# Patient Record
Sex: Female | Born: 2000 | Race: Black or African American | Hispanic: No | Marital: Single | Smoking: Never smoker
Health system: Southern US, Community
[De-identification: ages and names within clinical notes are randomized; demographics above are authoritative.]

## PROBLEM LIST (undated history)

## (undated) DIAGNOSIS — R519 Headache, unspecified: Secondary | ICD-10-CM

## (undated) DIAGNOSIS — R51 Headache: Secondary | ICD-10-CM

---
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
## 2019-12-05 ENCOUNTER — Emergency Department (HOSPITAL_BASED_OUTPATIENT_CLINIC_OR_DEPARTMENT_OTHER)
Admission: EM | Admit: 2019-12-05 | Discharge: 2019-12-05 | Discharge status: Against Medical Advice | Payer: Medicaid Other

## 2019-12-05 ENCOUNTER — Other Ambulatory Visit: Payer: Self-pay

## 2019-12-05 NOTE — ED Notes (Signed)
Called 3 times for triage and no response.

## 2020-06-13 ENCOUNTER — Emergency Department (HOSPITAL_BASED_OUTPATIENT_CLINIC_OR_DEPARTMENT_OTHER)
Admission: EM | Admit: 2020-06-13 | Discharge: 2020-06-13 | Disposition: A | Discharge status: Home / Self Care | Payer: Medicaid Other | Attending: Emergency Medicine | Admitting: Emergency Medicine

## 2020-06-13 ENCOUNTER — Emergency Department (HOSPITAL_BASED_OUTPATIENT_CLINIC_OR_DEPARTMENT_OTHER): Payer: Medicaid Other

## 2020-06-13 ENCOUNTER — Encounter (HOSPITAL_BASED_OUTPATIENT_CLINIC_OR_DEPARTMENT_OTHER): Payer: Self-pay | Admitting: Emergency Medicine

## 2020-06-13 ENCOUNTER — Other Ambulatory Visit: Payer: Self-pay

## 2020-06-13 DIAGNOSIS — N201 Calculus of ureter: Secondary | ICD-10-CM | POA: Diagnosis not present

## 2020-06-13 DIAGNOSIS — R109 Unspecified abdominal pain: Secondary | ICD-10-CM | POA: Diagnosis present

## 2020-06-13 LAB — COMPREHENSIVE METABOLIC PANEL
ALT: 55 U/L — ABNORMAL HIGH (ref 0–44)
AST: 33 U/L (ref 15–41)
Albumin: 4.2 g/dL (ref 3.5–5.0)
Alkaline Phosphatase: 54 U/L (ref 38–126)
Anion gap: 10 (ref 5–15)
BUN: 14 mg/dL (ref 6–20)
CO2: 22 mmol/L (ref 22–32)
Calcium: 8.8 mg/dL — ABNORMAL LOW (ref 8.9–10.3)
Chloride: 101 mmol/L (ref 98–111)
Creatinine, Ser: 0.77 mg/dL (ref 0.44–1.00)
GFR calc Af Amer: >60 mL/min (ref >60)
GFR calc non Af Amer: >60 mL/min (ref >60)
Glucose, Bld: 140 mg/dL — ABNORMAL HIGH (ref 70–99)
Potassium: 3.2 mmol/L — ABNORMAL LOW (ref 3.5–5.1)
Sodium: 133 mmol/L — ABNORMAL LOW (ref 135–145)
Total Bilirubin: 0.3 mg/dL (ref 0.3–1.2)
Total Protein: 7.6 g/dL (ref 6.5–8.1)

## 2020-06-13 LAB — URINALYSIS, ROUTINE W REFLEX MICROSCOPIC
Bilirubin Urine: NEGATIVE
Glucose, UA: NEGATIVE mg/dL
Ketones, ur: NEGATIVE mg/dL
Nitrite: NEGATIVE
Protein, ur: 30 mg/dL — AB
Specific Gravity, Urine: >1.03 — ABNORMAL HIGH (ref 1.005–1.030)
pH: 5.5 (ref 5.0–8.0)

## 2020-06-13 LAB — CBC
HCT: 37.5 % (ref 36.0–46.0)
Hemoglobin: 12.8 g/dL (ref 12.0–15.0)
MCH: 29 pg (ref 26.0–34.0)
MCHC: 34.1 g/dL (ref 30.0–36.0)
MCV: 85 fL (ref 80.0–100.0)
Platelets: 300 10*3/uL (ref 150–400)
RBC: 4.41 MIL/uL (ref 3.87–5.11)
RDW: 12.6 % (ref 11.5–15.5)
WBC: 8.1 10*3/uL (ref 4.0–10.5)
nRBC: 0 % (ref 0.0–0.2)

## 2020-06-13 LAB — URINALYSIS, MICROSCOPIC (REFLEX): RBC / HPF: >50 RBC/hpf (ref 0–5)

## 2020-06-13 LAB — HCG, SERUM, QUALITATIVE: Preg, Serum: NEGATIVE

## 2020-06-13 MED ORDER — HYDROCODONE-ACETAMINOPHEN 5-325 MG PO TABS: 0.5000 | ORAL_TABLET | Freq: Three times a day (TID) | ORAL | 0 refills | Status: AC | PRN

## 2020-06-13 MED ORDER — IBUPROFEN 600 MG PO TABS: 600.0000 mg | ORAL_TABLET | Freq: Four times a day (QID) | ORAL | 0 refills | Status: DC | PRN

## 2020-06-13 MED ORDER — CEPHALEXIN 250 MG PO CAPS
500.0000 mg | ORAL_CAPSULE | Freq: Once | ORAL | Status: AC
  Administered 2020-06-13: 500 mg via ORAL
  Filled 2020-06-13: qty 2

## 2020-06-13 MED ORDER — ONDANSETRON HCL 4 MG/2ML IJ SOLN
4.0000 mg | Freq: Once | INTRAMUSCULAR | Status: AC | PRN
  Administered 2020-06-13: 4 mg via INTRAVENOUS
  Filled 2020-06-13: qty 2

## 2020-06-13 MED ORDER — CEPHALEXIN 500 MG PO CAPS: 500.0000 mg | ORAL_CAPSULE | Freq: Three times a day (TID) | ORAL | 0 refills | Status: AC

## 2020-06-13 MED ORDER — FENTANYL CITRATE (PF) 100 MCG/2ML IJ SOLN
50.0000 ug | INTRAMUSCULAR | Status: DC | PRN
  Administered 2020-06-13: 50 ug via INTRAVENOUS
  Filled 2020-06-13: qty 2

## 2020-06-13 NOTE — ED Notes (Signed)
Pt used bathroom in lobby. Unable to provide urine sample at this time.

## 2020-06-13 NOTE — ED Notes (Signed)
ED Provider at bedside. 

## 2020-06-13 NOTE — ED Triage Notes (Signed)
Pt c/o right sided abd pain that started at 9pm.

## 2020-06-13 NOTE — ED Provider Notes (Addendum)
[facility] EMERGENCY DEPARTMENT Provider Note  CSN: [id number] Arrival date & time: 06/13/20 [id number]  Chief Complaint(s) Abdominal Pain  HPI Tracy Lowery is a 19 y.o. female    CC: Flank pain  Onset/Duration: Sudden 2 hours ago Timing: Constant and fluctuating Location: Right side Quality: Cramping Severity: Severe Modifying Factors:  Improved by: Nothing  Worsened by: Nothing Associated Signs/Symptoms:  Pertinent (+): Nausea and one episode of nonbloody nonbilious emesis  Pertinent (-): Fevers, chills, chest pain, shortness of breath, dysuria Context: Last menstrual period 6/14   HPI  Past Medical History Past Medical History:  Diagnosis Date  . Frequent headaches    There are no problems to display for this patient.  Home Medication(s) Prior to Admission medications   Medication Sig Start Date End Date Taking? Authorizing Provider  cephALEXin (KEFLEX) 500 MG capsule Take 1 capsule (500 mg total) by mouth 3 (three) times daily for 5 days. 06/13/20 06/18/20  Nira Connardama, Payal Stanforth Eduardo, MD  HYDROcodone-acetaminophen (NORCO/VICODIN) 5-325 MG tablet Take 0.5-1 tablets by mouth every 8 (eight) hours as needed for up to 5 days for severe pain (That is not improved by your scheduled motrin regimen). Please do not exceed 4000 mg of acetaminophen (Tylenol) a 24-hour period. Please note that he may be prescribed additional medicine that contains acetaminophen. 06/13/20 06/18/20  Nira Connardama, Jaliel Deavers Eduardo, MD  ibuprofen (ADVIL) 600 MG tablet Take 1 tablet (600 mg total) by mouth every 6 (six) hours as needed. 06/13/20   Nira Connardama, Kaiyla Stahly Eduardo, MD                                                                                                                                    Past Surgical History History reviewed. No pertinent surgical history. Family History No family history on file.  Social History Social History   Tobacco Use  . Smoking status: Never Smoker  Substance Use  Topics  . Alcohol use: No  . Drug use: No   Allergies Patient has no known allergies.  Review of Systems Review of Systems All other systems are reviewed and are negative for acute change except as noted in the HPI  Physical Exam Vital Signs  I have reviewed the triage vital signs BP (!) 120/57   Pulse 75   Temp 97.9 F (36.6 C) (Oral)   Resp 16   Ht 5\' 6"  (1.676 m)   Wt 63.5 kg   LMP 05/15/2020 (Approximate)   SpO2 100%   BMI 22.60 kg/m   Physical Exam Vitals reviewed.  Constitutional:      General: She is not in acute distress.    Appearance: She is well-developed. She is not diaphoretic.  HENT:     Head: Normocephalic and atraumatic.     Right Ear: External ear normal.     Left Ear: External ear normal.     Nose: Nose normal.  Eyes:     General: No  scleral icterus.    Conjunctiva/sclera: Conjunctivae normal.  Neck:     Trachea: Phonation normal.  Cardiovascular:     Rate and Rhythm: Normal rate and regular rhythm.  Pulmonary:     Effort: Pulmonary effort is normal. No respiratory distress.     Breath sounds: No stridor.  Abdominal:     General: There is no distension.     Tenderness: There is no abdominal tenderness. There is no guarding or rebound. Negative signs include Rovsing's sign, McBurney's sign, psoas sign and obturator sign.  Musculoskeletal:        General: Normal range of motion.     Cervical back: Normal range of motion.  Neurological:     Mental Status: She is alert and oriented to person, place, and time.  Psychiatric:        Behavior: Behavior normal.     ED Results and Treatments Labs (all labs ordered are listed, but only abnormal results are displayed) Labs Reviewed  COMPREHENSIVE METABOLIC PANEL - Abnormal; Notable for the following components:      Result Value   Sodium 133 (*)    Potassium 3.2 (*)    Glucose, Bld 140 (*)    Calcium 8.8 (*)    ALT 55 (*)    All other components within normal limits  URINALYSIS, ROUTINE W  REFLEX MICROSCOPIC - Abnormal; Notable for the following components:   APPearance CLOUDY (*)    Specific Gravity, Urine >1.030 (*)    Hgb urine dipstick LARGE (*)    Protein, ur 30 (*)    Leukocytes,Ua TRACE (*)    All other components within normal limits  URINALYSIS, MICROSCOPIC (REFLEX) - Abnormal; Notable for the following components:   Bacteria, UA MANY (*)    All other components within normal limits  CBC  HCG, SERUM, QUALITATIVE                                                                                                                         EKG  EKG Interpretation  Date/Time:    Ventricular Rate:    PR Interval:    QRS Duration:   QT Interval:    QTC Calculation:   R Axis:     Text Interpretation:        Radiology CT Renal Stone Study  Result Date: 06/13/2020 CLINICAL DATA:  Right-sided abdominal pain EXAM: CT ABDOMEN AND PELVIS WITHOUT CONTRAST TECHNIQUE: Multidetector CT imaging of the abdomen and pelvis was performed following the standard protocol without IV contrast. COMPARISON:  None available FINDINGS: Lower chest:  No contributory findings. Hepatobiliary: No focal liver abnormality.No evidence of biliary obstruction or stone. Pancreas: Unremarkable. Spleen: Unremarkable. Adrenals/Urinary Tract: Negative adrenals. Mild right hydroureteronephrosis and cortical low-density related to a 3 mm distal ureteral calculus, at the right hemipelvis. Unremarkable bladder. Stomach/Bowel:  No obstruction. No appendicitis. Vascular/Lymphatic: No acute vascular abnormality. No mass or adenopathy. Reproductive:IUD in place. Other: No ascites or pneumoperitoneum. Musculoskeletal: No acute abnormalities. L4-5 shallow  disc protrusion. IMPRESSION: 3 mm distal right ureteral calculus with mild hydronephrosis. Electronically Signed   By: Marnee Spring M.D.   On: 06/13/2020 05:45    Pertinent labs & imaging results that were available during my care of the patient were reviewed by me  and considered in my medical decision making (see chart for details).  Medications Ordered in ED Medications  fentaNYL (SUBLIMAZE) injection 50 mcg (50 mcg Intravenous Given 06/13/20 0352)  ondansetron (ZOFRAN) injection 4 mg (4 mg Intravenous Given 06/13/20 0352)  cephALEXin (KEFLEX) capsule 500 mg (500 mg Oral Given 06/13/20 3500)                                                                                                                                    Procedures Procedures  (including critical care time)  Medical Decision Making / ED Course I have reviewed the nursing notes for this encounter and the patient's prior records (if available in EHR or on provided paperwork).   Annahi Short was evaluated in Emergency Department on 06/13/2020 for the symptoms described in the history of present illness. She was evaluated in the context of the global COVID-19 pandemic, which necessitated consideration that the patient might be at risk for infection with the SARS-CoV-2 virus that causes COVID-19. Institutional protocols and algorithms that pertain to the evaluation of patients at risk for COVID-19 are in a state of rapid change based on information released by regulatory bodies including the CDC and federal and state organizations. These policies and algorithms were followed during the patient's care in the ED.  Work up consistent with right ureteral stone. UA with bacteria and +leuks. Will treat for possible UTI as well.  Pain controlled. Negative pregnancy. Not suspicious for torsion      Final Clinical Impression(s) / ED Diagnoses Final diagnoses:  Right ureteral stone   The patient appears reasonably screened and/or stabilized for discharge and I doubt any other medical condition or other [facility] requiring further screening, evaluation, or treatment in the ED at this time prior to discharge. Safe for discharge with strict return precautions.  Disposition: Discharge  Condition:  Good  I have discussed the results, Dx and Tx plan with the patient/family who expressed understanding and agree(s) with the plan. Discharge instructions discussed at length. The patient/family was given strict return precautions who verbalized understanding of the instructions. No further questions at time of discharge.    ED Discharge Orders         Ordered    ibuprofen (ADVIL) 600 MG tablet  Every 6 hours PRN     Discontinue  Reprint     06/13/20 0617    HYDROcodone-acetaminophen (NORCO/VICODIN) 5-325 MG tablet  Every 8 hours PRN     Discontinue  Reprint     06/13/20 0617    cephALEXin (KEFLEX) 500 MG capsule  3 times daily     Discontinue  Reprint  06/13/20 0628          [place] narcotic database reviewed and no active prescriptions noted.   Follow Up: [facility] [address] [place] [address] [place] [address] [phone] Call  As needed      This chart was dictated using voice recognition software.  Despite best efforts to proofread,  errors can occur which can change the documentation meaning.     Nira Conn, MD 06/13/20 [phone]

## 2020-06-13 NOTE — Discharge Instructions (Signed)
The most common type of stone is a calcium oxalate stone. Decreasing foods that contain oxalate can help reduce your risk of developing future stones.

## 2020-12-22 IMAGING — CT CT RENAL STONE PROTOCOL
2 of 4 series · 17 of 46 positions shown, 19 images · non-contrast
Comparison: None available

CLINICAL DATA: Right-sided abdominal pain

EXAM:
CT ABDOMEN AND PELVIS WITHOUT CONTRAST
TECHNIQUE: Multidetector CT imaging of the abdomen and pelvis was performed
following the standard protocol without IV contrast.

[Series 2: axial st · axial · 0.61mm/px · z∈[+848,+1218]mm · 14 of 82 slices shown, 16 images]
[im 4/82  soft-tissue]
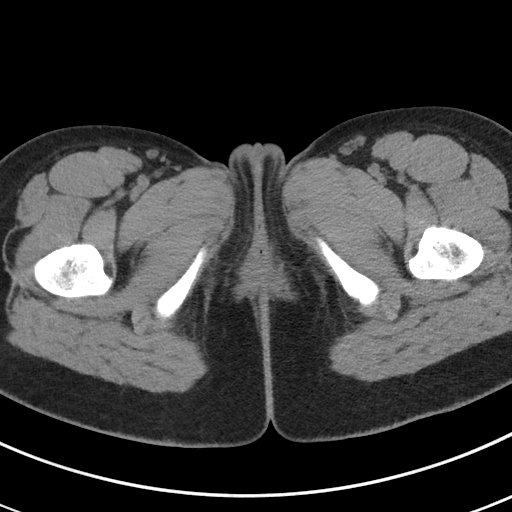
[im 4/82  bone]
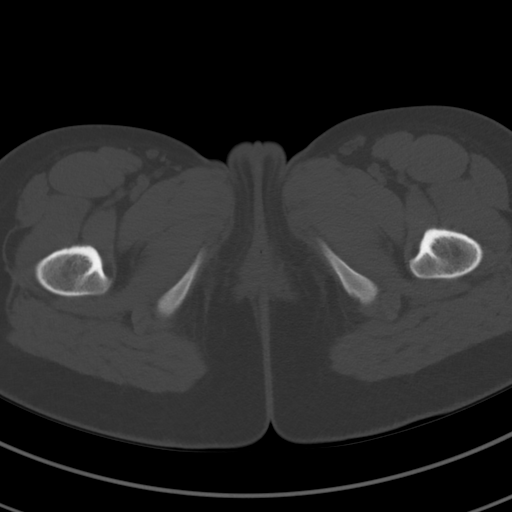
[im 11/82  soft-tissue]
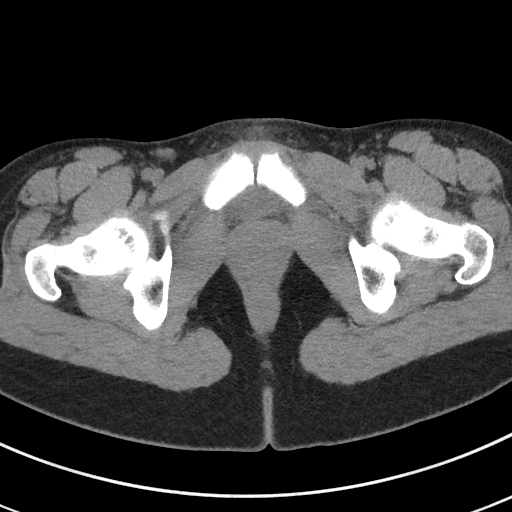
[im 17/82  soft-tissue]
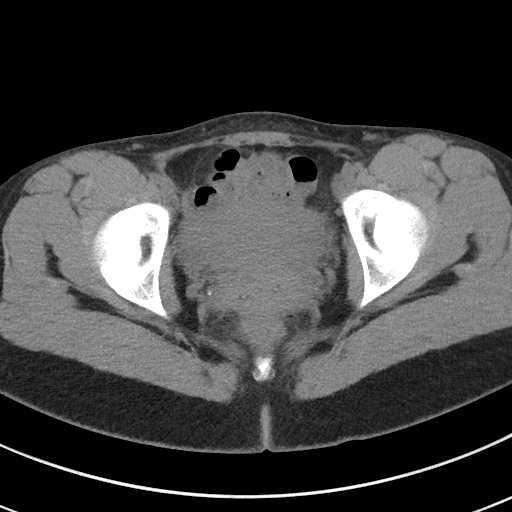
[im 21/82  soft-tissue]
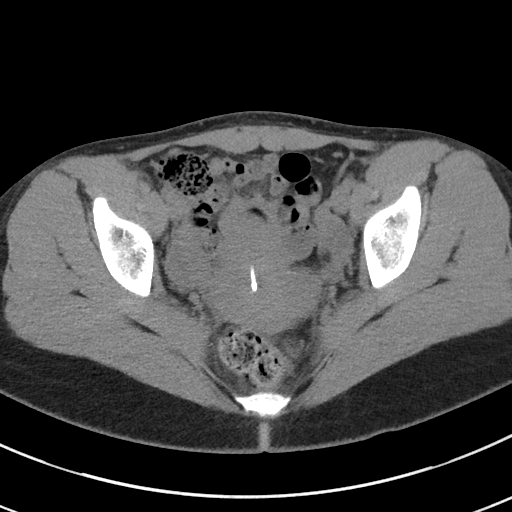
[im 28/82  soft-tissue]
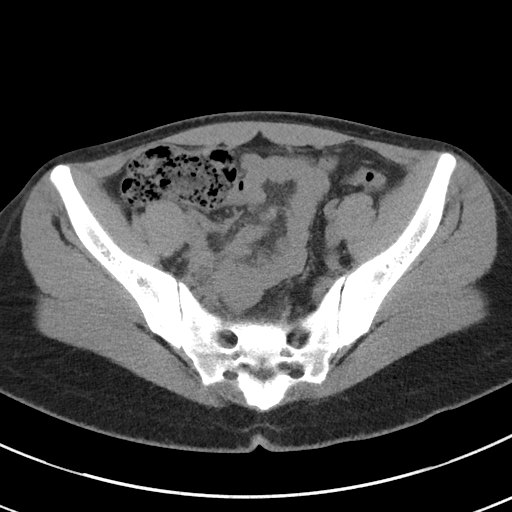
[im 34/82  soft-tissue]
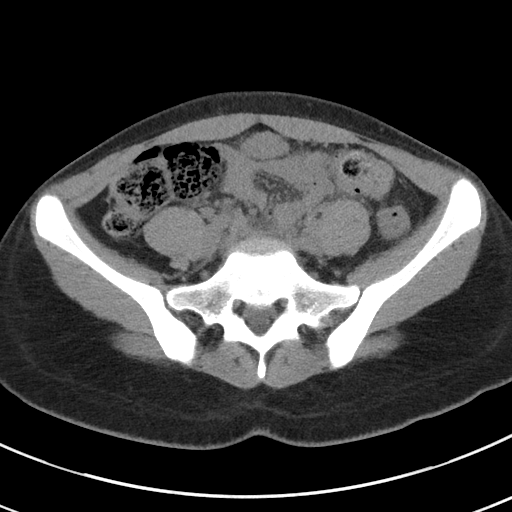
[im 38/82  soft-tissue]
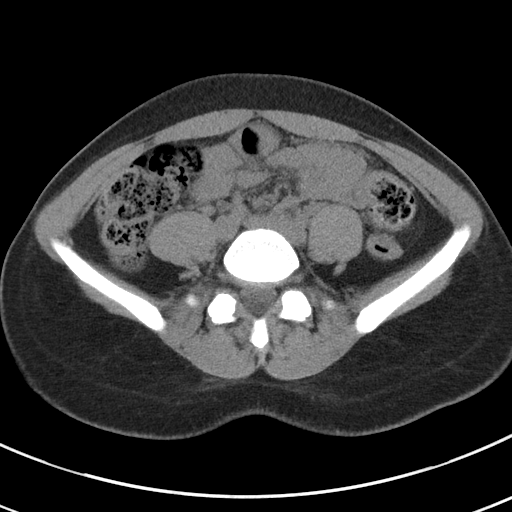
[im 44/82  soft-tissue]
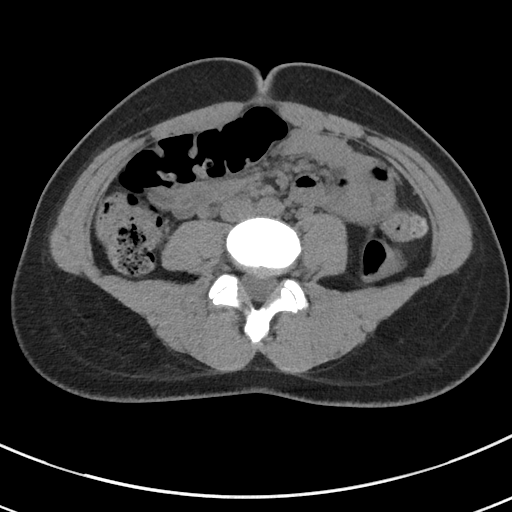
[im 48/82  soft-tissue]
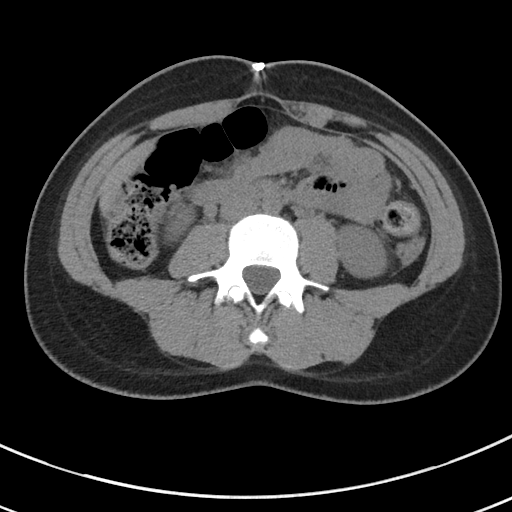
[im 48/82  bone]
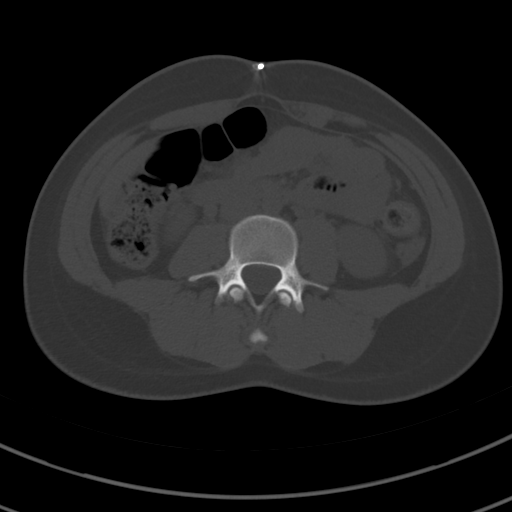
[im 55/82  soft-tissue]
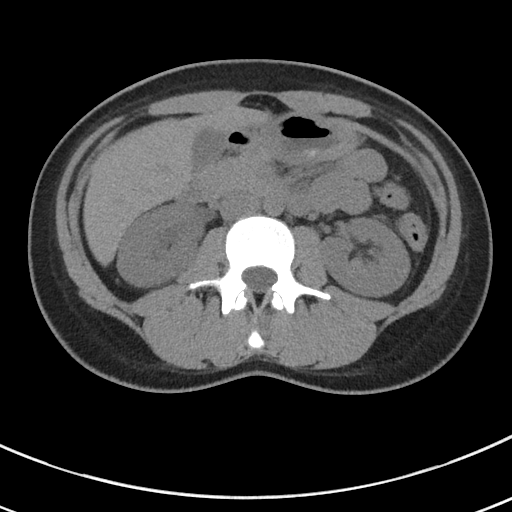
[im 61/82  soft-tissue]
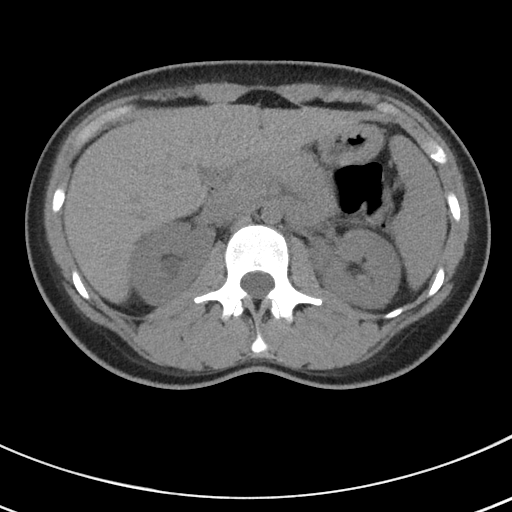
[im 65/82  soft-tissue]
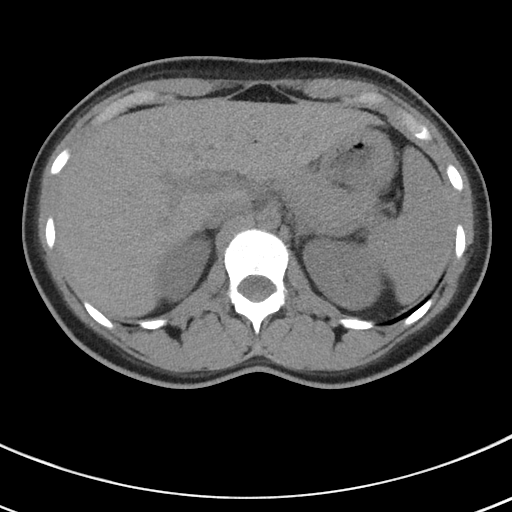
[im 71/82  soft-tissue]
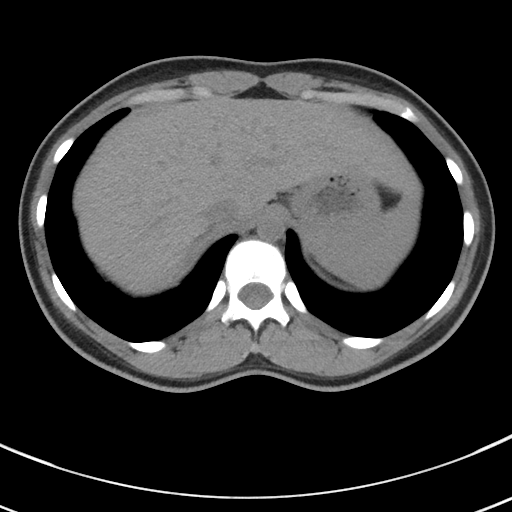
[im 78/82  soft-tissue]
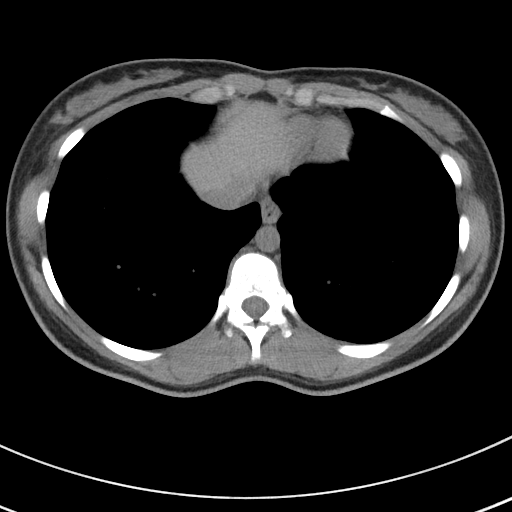

[Series 4: coronal st · coronal · 0.74mm/px · 3 of 79 slices shown]
[im 27/79  soft-tissue]
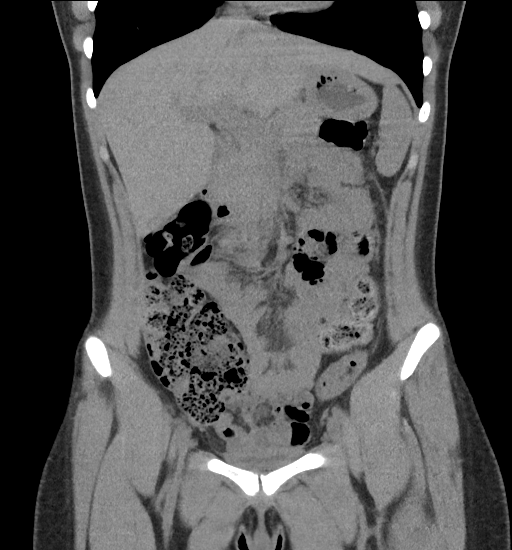
[im 35/79  soft-tissue]
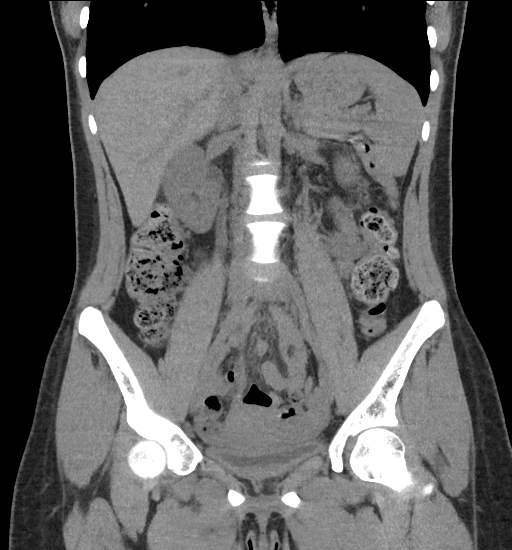
[im 44/79  soft-tissue]
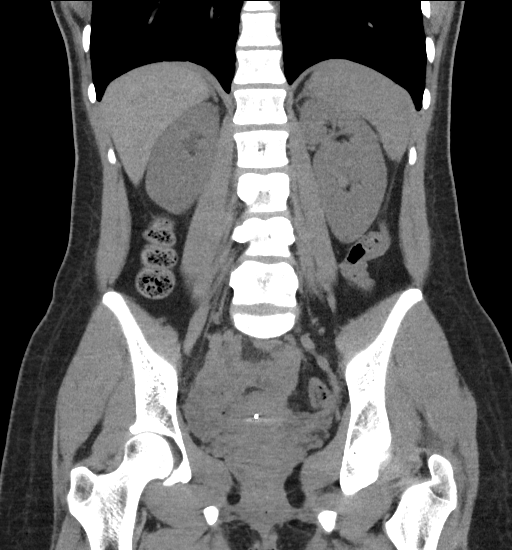

[17 of 46 positions shown; findings below may reference images not displayed]

FINDINGS: Lower chest:  No contributory findings.

Hepatobiliary: No focal liver abnormality.No evidence of biliary
obstruction or stone.

Pancreas: Unremarkable.

Spleen: Unremarkable.

Adrenals/Urinary Tract: Negative adrenals. Mild right
hydroureteronephrosis and cortical low-density related to a 3 mm
distal ureteral calculus, at the right hemipelvis. Unremarkable
bladder.

Stomach/Bowel:  No obstruction. No appendicitis.

Vascular/Lymphatic: No acute vascular abnormality. No mass or
adenopathy.

Reproductive:IUD in place.

Other: No ascites or pneumoperitoneum.

Musculoskeletal: No acute abnormalities. L4-5 shallow disc
protrusion.
IMPRESSION: 3 mm distal right ureteral calculus with mild hydronephrosis.

## 2023-04-04 ENCOUNTER — Encounter: Payer: Self-pay | Admitting: Obstetrics and Gynecology

## 2023-04-04 ENCOUNTER — Other Ambulatory Visit (HOSPITAL_COMMUNITY)
Admission: RE | Admit: 2023-04-04 | Discharge: 2023-04-04 | Disposition: A | Discharge status: Home / Self Care | Payer: 59 | Source: Ambulatory Visit | Attending: Obstetrics and Gynecology | Admitting: Obstetrics and Gynecology

## 2023-04-04 ENCOUNTER — Ambulatory Visit (INDEPENDENT_AMBULATORY_CARE_PROVIDER_SITE_OTHER): Payer: 59 | Admitting: Obstetrics and Gynecology

## 2023-04-04 VITALS — BP 113/62 | HR 59 | Wt 156.0 lb

## 2023-04-04 DIAGNOSIS — Z30432 Encounter for removal of intrauterine contraceptive device: Secondary | ICD-10-CM

## 2023-04-04 DIAGNOSIS — N760 Acute vaginitis: Secondary | ICD-10-CM | POA: Insufficient documentation

## 2023-04-04 MED ORDER — FLUCONAZOLE 150 MG PO TABS
150.0000 mg | ORAL_TABLET | Freq: Once | ORAL | 2 refills | Status: AC
Start: 2023-04-04 — End: 2023-04-04

## 2023-04-04 NOTE — Progress Notes (Signed)
NEW GYNECOLOGY PATIENT Patient name: Tracy Lowery MRN [id number]  Date of birth: [birth year] Chief Complaint:   IUD removal and Vaginitis     History:  Tracy Lowery is a 22 y.o. No obstetric history on file. being seen today for IUD removal and vaginitis. Having a rash and discharge. Kyleena inserted in 2019. Has had intermittent infections throughout the time it has been in place. Having vaginal itching and swelling is different than usual. Does not want to switch to other form of contraceptive. Not currently sexually active or seeking pregnancy.       Gynecologic History Patient's last menstrual period was 04/01/2023 (exact date). Contraception: none Last Pap: reports 2022 Last Mammogram: n/a Last Colonoscopy: n/a  Obstetric History OB History  No obstetric history on file.    Past Medical History:  Diagnosis Date   Frequent headaches     No past surgical history on file.  Current Outpatient Medications on File Prior to Visit  Medication Sig Dispense Refill   ibuprofen (ADVIL) 600 MG tablet Take 1 tablet (600 mg total) by mouth every 6 (six) hours as needed. (Patient not taking: Reported on 04/04/2023) 30 tablet 0   No current facility-administered medications on file prior to visit.    No Known Allergies  Social History:  reports that she has never smoked. She does not have any smokeless tobacco history on file. She reports that she does not drink alcohol and does not use drugs.  No family history on file.  The following portions of the patient's history were reviewed and updated as appropriate: allergies, current medications, past family history, past medical history, past social history, past surgical history and problem list.  Review of Systems Pertinent items noted in HPI and remainder of comprehensive ROS otherwise negative.  Physical Exam:  BP 113/62   Pulse (!) 59   Wt 156 lb (70.8 kg)   LMP 04/01/2023 (Exact Date)   BMI 25.18 kg/m  Physical Exam Vitals and  nursing note reviewed. Exam conducted with a chaperone present.  Constitutional:      Appearance: Normal appearance.  Cardiovascular:     Rate and Rhythm: Normal rate.  Pulmonary:     Effort: Pulmonary effort is normal.     Breath sounds: Normal breath sounds.  Genitourinary:    Cervix: Discharge present.     Comments: Erythematous and slightly edematous vulva IUD strings visualized Neurological:     General: No focal deficit present.     Mental Status: She is alert and oriented to person, place, and time.  Psychiatric:        Mood and Affect: Mood normal.        Behavior: Behavior normal.        Thought Content: Thought content normal.        Judgment: Judgment normal.    IUD Removal  Patient identified, informed consent performed, consent signed.  Patient was in the dorsal lithotomy position, normal external genitalia was noted.  A speculum was placed in the patient's vagina, normal discharge was noted, no lesions. The cervix was visualized, no lesions, no abnormal discharge.  The strings of the IUD were visualized, grasped and pulled using ring forceps. The IUD was removed in its entirety. . Patient tolerated the procedure well.      Assessment and Plan:  1. Encounter for IUD removal Now s/p uncomplicated IUD removal  2. Acute vaginitis Vaginitis noted, presumptive treatment with diflucan, vaginitis swab collected - fluconazole (DIFLUCAN) 150 MG tablet; Take 1  tablet (150 mg total) by mouth once for 1 dose.  Dispense: 1 tablet; Refill: 2 - Cervicovaginal ancillary only  Routine preventative health maintenance measures emphasized. Please refer to After Visit Summary for other counseling recommendations.   Follow-up: Return for Annual GYN.  Lorriane Shire, MD Obstetrician & Gynecologist, [facility] for [employer], [facility]

## 2023-04-04 NOTE — Patient Instructions (Signed)
Diflucan sent to the pharmacy for assumed yeast infection, if anything else returns positive on the swab, we will let you know and treat accordingly.

## 2023-04-07 LAB — CERVICOVAGINAL ANCILLARY ONLY
Bacterial Vaginitis (gardnerella): NEGATIVE
Candida Glabrata: NEGATIVE
Candida Vaginitis: POSITIVE — AB
Chlamydia: NEGATIVE
Comment: NEGATIVE
Comment: NEGATIVE
Comment: NEGATIVE
Comment: NEGATIVE
Comment: NEGATIVE
Comment: NORMAL
Neisseria Gonorrhea: NEGATIVE
Trichomonas: NEGATIVE

## 2023-07-02 ENCOUNTER — Ambulatory Visit (INDEPENDENT_AMBULATORY_CARE_PROVIDER_SITE_OTHER): Payer: 59

## 2023-07-02 ENCOUNTER — Other Ambulatory Visit (HOSPITAL_COMMUNITY)
Admission: RE | Admit: 2023-07-02 | Discharge: 2023-07-02 | Disposition: A | Discharge status: Home / Self Care | Payer: 59 | Source: Ambulatory Visit | Attending: Family Medicine | Admitting: Family Medicine

## 2023-07-02 VITALS — BP 120/56 | HR 96 | Wt 151.0 lb

## 2023-07-02 DIAGNOSIS — N898 Other specified noninflammatory disorders of vagina: Secondary | ICD-10-CM | POA: Insufficient documentation

## 2023-07-02 DIAGNOSIS — R309 Painful micturition, unspecified: Secondary | ICD-10-CM

## 2023-07-02 LAB — POCT URINALYSIS DIPSTICK
Bilirubin, UA: NEGATIVE
Blood, UA: NEGATIVE
Glucose, UA: NEGATIVE
Ketones, UA: NEGATIVE
Leukocytes, UA: NEGATIVE
Nitrite, UA: NEGATIVE
Protein, UA: NEGATIVE
Spec Grav, UA: 1.02 (ref 1.010–1.025)
Urobilinogen, UA: 0.2 E.U./dL
pH, UA: 6 (ref 5.0–8.0)

## 2023-07-02 NOTE — Progress Notes (Signed)
SUBJECTIVE:  22 y.o. female complains of yellow vaginal discharge for 4 day(s). Denies abnormal vaginal bleeding or significant pelvic pain or fever. Patient c/o painful urination. Denies history of known exposure to STD.  Patient's last menstrual period was 06/16/2023 (exact date).  OBJECTIVE:  She appears well, afebrile. Urine dipstick: not done.  ASSESSMENT:  Vaginal Discharge     PLAN:  GC, chlamydia, trichomonas, BVAG, CVAG probe sent to lab. Treatment: To be determined once lab results are received ROV prn if symptoms persist or worsen.   Seraphim Affinito l Leyton Brownlee, CMA

## 2023-07-13 ENCOUNTER — Other Ambulatory Visit: Payer: Self-pay | Admitting: Obstetrics & Gynecology

## 2023-07-13 DIAGNOSIS — B3731 Acute candidiasis of vulva and vagina: Secondary | ICD-10-CM

## 2023-07-13 MED ORDER — TERCONAZOLE 0.4 % VA CREA
1.0000 | TOPICAL_CREAM | Freq: Every day | VAGINAL | 0 refills | Status: AC
Start: 2023-07-13 — End: ?

## 2023-07-30 ENCOUNTER — Ambulatory Visit: Payer: 59 | Admitting: Obstetrics and Gynecology

## 2023-08-26 ENCOUNTER — Ambulatory Visit (INDEPENDENT_AMBULATORY_CARE_PROVIDER_SITE_OTHER): Payer: 59

## 2023-08-26 VITALS — BP 111/74 | HR 73 | Wt 149.0 lb

## 2023-08-26 DIAGNOSIS — R399 Unspecified symptoms and signs involving the genitourinary system: Secondary | ICD-10-CM

## 2023-08-26 LAB — POCT URINALYSIS DIPSTICK
Bilirubin, UA: NEGATIVE
Blood, UA: NEGATIVE
Glucose, UA: NEGATIVE
Ketones, UA: NEGATIVE
Nitrite, UA: POSITIVE
Protein, UA: NEGATIVE
Spec Grav, UA: 1.02 (ref 1.010–1.025)
Urobilinogen, UA: 0.2 E.U./dL
pH, UA: 7 (ref 5.0–8.0)

## 2023-08-26 MED ORDER — NITROFURANTOIN MONOHYD MACRO 100 MG PO CAPS
ORAL_CAPSULE | ORAL | 0 refills | Status: DC
Start: 2023-08-26 — End: 2024-06-24

## 2023-08-26 NOTE — Progress Notes (Addendum)
SUBJECTIVE: Tracy Lowery is a 21 y.o. female who complains of urinary frequency, urgency and dysuria x 3 days, without flank pain, fever, chills, or abnormal vaginal discharge or bleeding.   OBJECTIVE: Appears well, in no apparent distress.  Vital signs are normal. Urine dipstick shows positive for WBC's.    ASSESSMENT: Dysuria  PLAN: Treatment per orders.  Call or return to clinic prn if these symptoms worsen or fail to improve as anticipated.   Zayvian Mcmurtry l Kostantinos Tallman, CMA

## 2023-08-30 LAB — URINE CULTURE

## 2024-06-24 ENCOUNTER — Ambulatory Visit
Admission: EM | Admit: 2024-06-24 | Discharge: 2024-06-24 | Disposition: A | Discharge status: Home / Self Care | Attending: Family Medicine | Admitting: Family Medicine

## 2024-06-24 DIAGNOSIS — N3001 Acute cystitis with hematuria: Secondary | ICD-10-CM | POA: Diagnosis present

## 2024-06-24 DIAGNOSIS — R300 Dysuria: Secondary | ICD-10-CM

## 2024-06-24 LAB — POCT URINALYSIS DIP (MANUAL ENTRY)
Bilirubin, UA: NEGATIVE
Glucose, UA: NEGATIVE mg/dL
Ketones, POC UA: NEGATIVE mg/dL
Nitrite, UA: NEGATIVE
Protein Ur, POC: NEGATIVE mg/dL
Spec Grav, UA: 1.025 (ref 1.010–1.025)
Urobilinogen, UA: 0.2 U/dL
pH, UA: 6 (ref 5.0–8.0)

## 2024-06-24 LAB — POCT URINE PREGNANCY: Preg Test, Ur: NEGATIVE

## 2024-06-24 MED ORDER — FLUCONAZOLE 150 MG PO TABS: 150.0000 mg | ORAL_TABLET | ORAL | 0 refills | Status: DC

## 2024-06-24 MED ORDER — CEPHALEXIN 500 MG PO CAPS: 500.0000 mg | ORAL_CAPSULE | Freq: Two times a day (BID) | ORAL | 0 refills | Status: DC

## 2024-06-24 NOTE — ED Triage Notes (Signed)
 Pt c/o dysuria, foul smelling urine x 3 days-NAD-steady gait

## 2024-06-24 NOTE — ED Provider Notes (Signed)
 [facility] URGENT [facility]ARE C[facility]  Note:  This document was prepared using Conservation officer, historic buildings and may include unintentional dictation errors.  MRN: [id number] DOB: March 14, 2001  Subjective:   Tracy Lowery is a 23 y.o. female presenting for 3 day history of acute onset dysuria, malodorous urine, urinary frequency and urgency.  Denies fever, n/v, abdominal pain, pelvic pain, rashes, hematuria, vaginal discharge.  Patient has a history of urinary tract infections with resistance. No concern for STI. Hydrates with about 32-40 ounces of water daily.   No chronic medications.    No Known Allergies  Past Medical History:  Diagnosis Date   Frequent headaches      History reviewed. No pertinent surgical history.  No family history on file.  Social History   Tobacco Use   Smoking status: Never   Smokeless tobacco: Never  Vaping Use   Vaping status: Never Used  Substance Use Topics   Alcohol use: Yes    Comment: occ   Drug use: No    ROS   Objective:   Vitals: BP 120/81 (BP Location: Right Arm)   Pulse 68   Temp 98.3 F (36.8 C) (Oral)   Resp 16   LMP 05/29/2024   Physical Exam Constitutional:      General: She is not in acute distress.    Appearance: Normal appearance. She is well-developed. She is not ill-appearing, toxic-appearing or diaphoretic.  HENT:     Head: Normocephalic and atraumatic.     Right Ear: External ear normal.     Left Ear: External ear normal.     Nose: Nose normal.     Mouth/Throat:     Mouth: Mucous membranes are moist.  Eyes:     General: No scleral icterus.       Right eye: No discharge.        Left eye: No discharge.     Extraocular Movements: Extraocular movements intact.  Cardiovascular:     Rate and Rhythm: Normal rate.  Pulmonary:     Effort: Pulmonary effort is normal.  Skin:    General: Skin is warm and dry.  Neurological:     General: No focal deficit present.     Mental Status: She is alert and oriented to  person, place, and time.  Psychiatric:        Mood and Affect: Mood normal.        Behavior: Behavior normal.     Results for orders placed or performed during the hospital encounter of 06/24/24 (from the past 24 hours)  POCT urinalysis dipstick     Status: Abnormal   Collection Time: 06/24/24  8:41 AM  Result Value Ref Range   Color, UA yellow yellow   Clarity, UA cloudy (A) clear   Glucose, UA negative negative mg/dL   Bilirubin, UA negative negative   Ketones, POC UA negative negative mg/dL   Spec Grav, UA 8.974 8.989 - 1.025   Blood, UA moderate (A) negative   pH, UA 6.0 5.0 - 8.0   Protein Ur, POC negative negative mg/dL   Urobilinogen, UA 0.2 0.2 or 1.0 E.U./dL   Nitrite, UA Negative Negative   Leukocytes, UA Moderate (2+) (A) Negative  POCT urine pregnancy     Status: Normal   Collection Time: 06/24/24  8:43 AM  Result Value Ref Range   Preg Test, Ur Negative Negative    Assessment and Plan :   PDMP not reviewed this encounter.  1. Dysuria  Start cephalexin  to cover for acute cystitis, urine culture pending.  Recommended aggressive hydration, limiting urinary irritants. Counseled patient on potential for adverse effects with medications prescribed/recommended today, ER and return-to-clinic precautions discussed, patient verbalized understanding.    Christopher Savannah, [place] 06/24/24 [phone]

## 2024-06-24 NOTE — Discharge Instructions (Signed)

## 2024-06-26 LAB — URINE CULTURE: Culture: 70000 — AB

## 2024-06-28 ENCOUNTER — Ambulatory Visit (HOSPITAL_COMMUNITY): Payer: Self-pay

## 2024-12-28 ENCOUNTER — Ambulatory Visit
Admission: EM | Admit: 2024-12-28 | Discharge: 2024-12-28 | Disposition: A | Discharge status: Home / Self Care | Attending: Family Medicine | Admitting: Family Medicine

## 2024-12-28 DIAGNOSIS — N390 Urinary tract infection, site not specified: Secondary | ICD-10-CM | POA: Diagnosis not present

## 2024-12-28 LAB — POCT URINE DIPSTICK
Bilirubin, UA: NEGATIVE
Glucose, UA: NEGATIVE mg/dL
Ketones, POC UA: NEGATIVE mg/dL
Nitrite, UA: NEGATIVE
Protein Ur, POC: 30 mg/dL — AB
Spec Grav, UA: >=1.03 — AB
Urobilinogen, UA: 0.2 U/dL
pH, UA: 5.5

## 2024-12-28 MED ORDER — CEPHALEXIN 500 MG PO CAPS: 500.0000 mg | ORAL_CAPSULE | Freq: Two times a day (BID) | ORAL | 0 refills | Status: AC

## 2024-12-28 MED ORDER — FLUCONAZOLE 150 MG PO TABS: 150.0000 mg | ORAL_TABLET | ORAL | 0 refills | Status: AC

## 2024-12-28 NOTE — Discharge Instructions (Addendum)
 Please start cephalexin  to address an urinary tract infection. Make sure you hydrate very well with plain water and a quantity of 80 ounces of water a day.  Please limit drinks that are considered urinary irritants such as fruit juices, soda, sweet tea, coffee, artifical sweetened drinks, energy drinks, alcohol.  These can worsen your urinary and genital symptoms but also be the source of them.  I will let you know about your urine culture results through MyChart to see if we need to prescribe or change your antibiotics based off of those results.

## 2024-12-28 NOTE — ED Triage Notes (Signed)
 Pt c/o urinary freq, dysuria and odor started yesterday-no meds PTA-NAD-steady gait

## 2024-12-28 NOTE — ED Provider Notes (Signed)
 " [facility]  Note:  This document was prepared using Conservation officer, historic buildings and may include unintentional dictation errors.  MRN: [id number] DOB: 2001-11-04  Subjective:   Tracy Lowery is a 24 y.o. female presenting for 1 day history of urine frequency, dysuria, malodorous urine.  Denies fever, n/v, abdominal pain, pelvic pain, rashes, hematuria, vaginal discharge.    Current Outpatient Medications  Medication Instructions   cephALEXin  (KEFLEX ) 500 mg, Oral, 2 times daily   fluconazole  (DIFLUCAN ) 150 mg, Oral, Weekly   terconazole  (TERAZOL 7 ) 0.4 % vaginal cream 1 applicator, Vaginal, Daily at bedtime, Use for seven days    Allergies[1]  Past Medical History:  Diagnosis Date   Frequent headaches      History reviewed. No pertinent surgical history.  No family history on file.  Social History   Occupational History   Not on file  Tobacco Use   Smoking status: Never   Smokeless tobacco: Never  Vaping Use   Vaping status: Never Used  Substance and Sexual Activity   Alcohol use: Yes    Comment: occ   Drug use: No   Sexual activity: Yes    Birth control/protection: None     ROS   Objective:   Vitals: BP 120/67 (BP Location: Right Arm)   Pulse 83   Temp 98.7 F (37.1 C) (Oral)   Resp 20   LMP 11/27/2024   SpO2 98%   Physical Exam Constitutional:      General: She is not in acute distress.    Appearance: Normal appearance. She is well-developed. She is not ill-appearing, toxic-appearing or diaphoretic.  HENT:     Head: Normocephalic and atraumatic.     Nose: Nose normal.     Mouth/Throat:     Mouth: Mucous membranes are moist.  Eyes:     General: No scleral icterus.       Right eye: No discharge.        Left eye: No discharge.     Extraocular Movements: Extraocular movements intact.     Conjunctiva/sclera: Conjunctivae normal.  Cardiovascular:     Rate and Rhythm: Normal rate.  Pulmonary:     Effort: Pulmonary  effort is normal.  Abdominal:     General: Bowel sounds are normal. There is no distension.     Palpations: Abdomen is soft. There is no mass.     Tenderness: There is no abdominal tenderness. There is no right CVA tenderness, left CVA tenderness, guarding or rebound.  Skin:    General: Skin is warm and dry.  Neurological:     General: No focal deficit present.     Mental Status: She is alert and oriented to person, place, and time.  Psychiatric:        Mood and Affect: Mood normal.        Behavior: Behavior normal.        Thought Content: Thought content normal.        Judgment: Judgment normal.     Results for orders placed or performed during the hospital encounter of 12/28/24 (from the past 24 hours)  POCT URINE DIPSTICK     Status: Abnormal   Collection Time: 12/28/24 10:45 AM  Result Value Ref Range   Color, UA yellow yellow   Clarity, UA cloudy (A) clear   Glucose, UA negative negative mg/dL   Bilirubin, UA negative negative   Ketones, POC UA negative negative mg/dL   Spec Grav, UA >=8.969 (A) 1.010 -  1.025   Blood, UA large (A) negative   pH, UA 5.5 5.0 - 8.0   Protein Ur, POC =30 (A) negative mg/dL   Urobilinogen, UA 0.2 0.2 or 1.0 E.U./dL   Nitrite, UA Negative Negative   Leukocytes, UA Small (1+) (A) Negative    Assessment and Plan :   PDMP not reviewed this encounter.  1. Recurrent UTI      Start cephalexin  to cover for acute cystitis, urine culture pending.  Recommended consistent hydration, limiting urinary irritants. Counseled patient on potential for adverse effects with medications prescribed/recommended today, ER and return-to-clinic precautions discussed, patient verbalized understanding.     [1] No Known Allergies    Christopher Savannah, PA-C 12/28/24 1048  "

## 2024-12-30 LAB — URINE CULTURE: Culture: >=100000 — AB

## 2025-01-01 ENCOUNTER — Ambulatory Visit (HOSPITAL_COMMUNITY): Payer: Self-pay

## 2025-01-01 MED ORDER — NITROFURANTOIN MONOHYD MACRO 100 MG PO CAPS: 100.0000 mg | ORAL_CAPSULE | Freq: Two times a day (BID) | ORAL | 0 refills | Status: AC
